# Patient Record
Sex: Female | Born: 1960 | Race: White | Hispanic: No | Marital: Married | Smoking: Never smoker
Health system: Southern US, Community
[De-identification: ages and names within clinical notes are randomized; demographics above are authoritative.]

---

## 2003-07-19 ENCOUNTER — Encounter: Admission: RE | Admit: 2003-07-19 | Discharge: 2003-07-19 | Payer: Self-pay

## 2004-08-06 ENCOUNTER — Encounter: Admission: RE | Admit: 2004-08-06 | Discharge: 2004-08-06 | Payer: Self-pay | Admitting: Internal Medicine

## 2005-08-21 ENCOUNTER — Encounter: Admission: RE | Admit: 2005-08-21 | Discharge: 2005-08-21 | Payer: Self-pay | Admitting: Internal Medicine

## 2006-09-30 ENCOUNTER — Encounter: Admission: RE | Admit: 2006-09-30 | Discharge: 2006-09-30 | Payer: Self-pay | Admitting: Internal Medicine

## 2007-10-11 ENCOUNTER — Encounter: Admission: RE | Admit: 2007-10-11 | Discharge: 2007-10-11 | Payer: Self-pay | Admitting: Internal Medicine

## 2008-10-16 ENCOUNTER — Encounter: Admission: RE | Admit: 2008-10-16 | Discharge: 2008-10-16 | Payer: Self-pay | Admitting: Internal Medicine

## 2009-10-17 ENCOUNTER — Encounter: Admission: RE | Admit: 2009-10-17 | Discharge: 2009-10-17 | Payer: Self-pay | Admitting: Internal Medicine

## 2010-07-27 ENCOUNTER — Encounter: Payer: Self-pay | Admitting: Internal Medicine

## 2010-09-22 ENCOUNTER — Other Ambulatory Visit: Payer: Self-pay | Admitting: *Deleted

## 2010-09-22 DIAGNOSIS — Z1231 Encounter for screening mammogram for malignant neoplasm of breast: Secondary | ICD-10-CM

## 2010-10-27 ENCOUNTER — Ambulatory Visit
Admission: RE | Admit: 2010-10-27 | Discharge: 2010-10-27 | Disposition: A | Discharge status: Home / Self Care | Payer: BC Managed Care – PPO | Source: Ambulatory Visit | Attending: *Deleted | Admitting: *Deleted

## 2010-10-27 ENCOUNTER — Ambulatory Visit: Payer: BC Managed Care – PPO

## 2010-10-27 DIAGNOSIS — Z1231 Encounter for screening mammogram for malignant neoplasm of breast: Secondary | ICD-10-CM

## 2011-10-07 ENCOUNTER — Other Ambulatory Visit: Payer: Self-pay | Admitting: Internal Medicine

## 2011-10-07 DIAGNOSIS — Z1231 Encounter for screening mammogram for malignant neoplasm of breast: Secondary | ICD-10-CM

## 2011-11-03 ENCOUNTER — Ambulatory Visit
Admission: RE | Admit: 2011-11-03 | Discharge: 2011-11-03 | Disposition: A | Discharge status: Home / Self Care | Payer: BC Managed Care – PPO | Source: Ambulatory Visit | Attending: Internal Medicine | Admitting: Internal Medicine

## 2011-11-03 DIAGNOSIS — Z1231 Encounter for screening mammogram for malignant neoplasm of breast: Secondary | ICD-10-CM

## 2012-10-04 ENCOUNTER — Other Ambulatory Visit: Payer: Self-pay

## 2012-10-04 DIAGNOSIS — Z1231 Encounter for screening mammogram for malignant neoplasm of breast: Secondary | ICD-10-CM

## 2012-11-03 ENCOUNTER — Ambulatory Visit
Admission: RE | Admit: 2012-11-03 | Discharge: 2012-11-03 | Disposition: A | Discharge status: Home / Self Care | Payer: BC Managed Care – PPO | Source: Ambulatory Visit

## 2012-11-03 DIAGNOSIS — Z1231 Encounter for screening mammogram for malignant neoplasm of breast: Secondary | ICD-10-CM

## 2013-09-29 ENCOUNTER — Other Ambulatory Visit: Payer: Self-pay

## 2013-09-29 DIAGNOSIS — Z1231 Encounter for screening mammogram for malignant neoplasm of breast: Secondary | ICD-10-CM

## 2013-11-06 ENCOUNTER — Encounter (INDEPENDENT_AMBULATORY_CARE_PROVIDER_SITE_OTHER): Payer: Self-pay

## 2013-11-06 ENCOUNTER — Ambulatory Visit
Admission: RE | Admit: 2013-11-06 | Discharge: 2013-11-06 | Disposition: A | Discharge status: Home / Self Care | Payer: BC Managed Care – PPO | Source: Ambulatory Visit

## 2013-11-06 DIAGNOSIS — Z1231 Encounter for screening mammogram for malignant neoplasm of breast: Secondary | ICD-10-CM

## 2014-12-24 ENCOUNTER — Other Ambulatory Visit: Payer: Self-pay

## 2014-12-24 DIAGNOSIS — Z1231 Encounter for screening mammogram for malignant neoplasm of breast: Secondary | ICD-10-CM

## 2015-01-14 ENCOUNTER — Ambulatory Visit: Payer: Self-pay

## 2015-01-21 ENCOUNTER — Ambulatory Visit
Admission: RE | Admit: 2015-01-21 | Discharge: 2015-01-21 | Disposition: A | Discharge status: Home / Self Care | Payer: BLUE CROSS/BLUE SHIELD | Source: Ambulatory Visit

## 2015-01-21 DIAGNOSIS — Z1231 Encounter for screening mammogram for malignant neoplasm of breast: Secondary | ICD-10-CM

## 2016-01-08 ENCOUNTER — Other Ambulatory Visit: Payer: Self-pay | Admitting: Internal Medicine

## 2016-01-08 DIAGNOSIS — Z1231 Encounter for screening mammogram for malignant neoplasm of breast: Secondary | ICD-10-CM

## 2016-02-03 ENCOUNTER — Ambulatory Visit
Admission: RE | Admit: 2016-02-03 | Discharge: 2016-02-03 | Disposition: A | Discharge status: Home / Self Care | Payer: BLUE CROSS/BLUE SHIELD | Source: Ambulatory Visit | Attending: Internal Medicine | Admitting: Internal Medicine

## 2016-02-03 DIAGNOSIS — Z1231 Encounter for screening mammogram for malignant neoplasm of breast: Secondary | ICD-10-CM

## 2017-05-14 ENCOUNTER — Other Ambulatory Visit: Payer: Self-pay | Admitting: Internal Medicine

## 2017-05-14 DIAGNOSIS — Z1231 Encounter for screening mammogram for malignant neoplasm of breast: Secondary | ICD-10-CM

## 2017-06-02 ENCOUNTER — Ambulatory Visit: Payer: BLUE CROSS/BLUE SHIELD | Admitting: Podiatry

## 2017-06-02 ENCOUNTER — Other Ambulatory Visit: Payer: Self-pay | Admitting: Podiatry

## 2017-06-02 ENCOUNTER — Ambulatory Visit: Payer: Self-pay

## 2017-06-02 ENCOUNTER — Ambulatory Visit (INDEPENDENT_AMBULATORY_CARE_PROVIDER_SITE_OTHER): Payer: BLUE CROSS/BLUE SHIELD

## 2017-06-02 ENCOUNTER — Encounter: Payer: Self-pay | Admitting: Podiatry

## 2017-06-02 DIAGNOSIS — M79672 Pain in left foot: Secondary | ICD-10-CM | POA: Diagnosis not present

## 2017-06-02 DIAGNOSIS — M722 Plantar fascial fibromatosis: Secondary | ICD-10-CM

## 2017-06-02 DIAGNOSIS — B351 Tinea unguium: Secondary | ICD-10-CM | POA: Diagnosis not present

## 2017-06-02 DIAGNOSIS — M79671 Pain in right foot: Secondary | ICD-10-CM | POA: Diagnosis not present

## 2017-06-02 MED ORDER — DICLOFENAC SODIUM 75 MG PO TBEC: 75.0000 mg | DELAYED_RELEASE_TABLET | Freq: Two times a day (BID) | ORAL | 2 refills | Status: AC

## 2017-06-02 MED ORDER — TRIAMCINOLONE ACETONIDE 10 MG/ML IJ SUSP
10.0000 mg | Freq: Once | INTRAMUSCULAR | Status: AC
  Administered 2017-06-02: 10 mg

## 2017-06-02 NOTE — Patient Instructions (Signed)

## 2017-06-02 NOTE — Progress Notes (Signed)
Subjective:    Patient ID: Stephanie Escobar, female   DOB: 56 y.o.   MRN: [id number]   HPI patient states that she walked barefoot on the beach and developed pain in her heel around 3 months ago. States it's been getting gradually worse over that time and the left is bothering her more than the right but they're both sore. Patient does not smoke and likes to be active    Review of Systems  All other systems reviewed and are negative.       Objective:  Physical Exam  Constitutional: She appears well-developed and well-nourished.  Cardiovascular: Intact distal pulses.  Pulmonary/Chest: Effort normal.  Musculoskeletal: Normal range of motion.  Neurological: She is alert.  Skin: Skin is warm.  Nursing note and vitals reviewed.  neurovascular status intact muscle strength adequate range of motion within normal limits with patient noted to have quite a bit of discomfort of the plantar fascial band left over right with inflammation fluid at the medial band at the insertional point of the tendon into the calcaneus. Patient does have high arch foot type with a lot of stress on the heel and forefoot     Assessment:    Inflammatory fasciitis left over right heel at the insertional point tendon into the calcaneus     Plan:   H&P conditions reviewed and injected the left plantar fashion 3 mg Kenalog 5 mg Xylocaine and applied fascial brace bilateral to lift the arch. Gave instructions on physical therapy and supportive shoes and reappoint to recheck  X-rays indicate cavus foot structure with spur formation plantar heel bilateral

## 2017-06-14 ENCOUNTER — Ambulatory Visit: Payer: BLUE CROSS/BLUE SHIELD

## 2017-06-18 ENCOUNTER — Encounter: Payer: Self-pay | Admitting: Podiatry

## 2017-06-18 ENCOUNTER — Ambulatory Visit (INDEPENDENT_AMBULATORY_CARE_PROVIDER_SITE_OTHER): Payer: BLUE CROSS/BLUE SHIELD | Admitting: Podiatry

## 2017-06-18 DIAGNOSIS — M722 Plantar fascial fibromatosis: Secondary | ICD-10-CM | POA: Diagnosis not present

## 2017-06-19 NOTE — Progress Notes (Signed)
Subjective:   Patient ID: Stephanie Escobar, female   DOB: 56 y.o.   MRN: [id number]   HPI Patient presents stating she is doing very well with minimal discomfort and is walking with minimal discomfort or pain at the current time   ROS      Objective:  Physical Exam  Neurovascular status intact with patient's heel doing very well with minimal discomfort upon palpation or swelling     Assessment:  Doing well from plantar fascial treatment over the last several months     Plan:  Reviewed at great length the fact this can recur we discussed different ways to prevent that from happening and also discussed consistent physical therapy to be done at home.  Patient will be seen back to recheck

## 2017-07-13 ENCOUNTER — Ambulatory Visit: Payer: BLUE CROSS/BLUE SHIELD

## 2017-07-19 ENCOUNTER — Ambulatory Visit
Admission: RE | Admit: 2017-07-19 | Discharge: 2017-07-19 | Disposition: A | Discharge status: Home / Self Care | Payer: BLUE CROSS/BLUE SHIELD | Source: Ambulatory Visit | Attending: Internal Medicine | Admitting: Internal Medicine

## 2017-07-19 DIAGNOSIS — Z1231 Encounter for screening mammogram for malignant neoplasm of breast: Secondary | ICD-10-CM

## 2018-07-06 HISTORY — PX: BREAST BIOPSY: SHX20

## 2018-07-15 ENCOUNTER — Other Ambulatory Visit: Payer: Self-pay | Admitting: Internal Medicine

## 2018-07-15 DIAGNOSIS — Z1231 Encounter for screening mammogram for malignant neoplasm of breast: Secondary | ICD-10-CM

## 2018-08-17 ENCOUNTER — Ambulatory Visit
Admission: RE | Admit: 2018-08-17 | Discharge: 2018-08-17 | Disposition: A | Discharge status: Home / Self Care | Payer: BLUE CROSS/BLUE SHIELD | Source: Ambulatory Visit | Attending: Internal Medicine | Admitting: Internal Medicine

## 2018-08-17 DIAGNOSIS — Z1231 Encounter for screening mammogram for malignant neoplasm of breast: Secondary | ICD-10-CM

## 2018-08-18 ENCOUNTER — Other Ambulatory Visit: Payer: Self-pay | Admitting: Internal Medicine

## 2018-08-18 DIAGNOSIS — R928 Other abnormal and inconclusive findings on diagnostic imaging of breast: Secondary | ICD-10-CM

## 2018-08-23 ENCOUNTER — Ambulatory Visit: Payer: BLUE CROSS/BLUE SHIELD

## 2018-08-29 ENCOUNTER — Ambulatory Visit
Admission: RE | Admit: 2018-08-29 | Discharge: 2018-08-29 | Disposition: A | Discharge status: Home / Self Care | Payer: BLUE CROSS/BLUE SHIELD | Source: Ambulatory Visit | Attending: Internal Medicine | Admitting: Internal Medicine

## 2018-08-29 ENCOUNTER — Other Ambulatory Visit: Payer: Self-pay | Admitting: Internal Medicine

## 2018-08-29 DIAGNOSIS — R928 Other abnormal and inconclusive findings on diagnostic imaging of breast: Secondary | ICD-10-CM

## 2018-08-29 DIAGNOSIS — N6310 Unspecified lump in the right breast, unspecified quadrant: Secondary | ICD-10-CM

## 2018-08-31 ENCOUNTER — Ambulatory Visit
Admission: RE | Admit: 2018-08-31 | Discharge: 2018-08-31 | Disposition: A | Discharge status: Home / Self Care | Payer: BLUE CROSS/BLUE SHIELD | Source: Ambulatory Visit | Attending: Internal Medicine | Admitting: Internal Medicine

## 2018-08-31 DIAGNOSIS — N6310 Unspecified lump in the right breast, unspecified quadrant: Secondary | ICD-10-CM

## 2018-09-01 ENCOUNTER — Other Ambulatory Visit: Payer: Self-pay | Admitting: Internal Medicine

## 2018-09-01 DIAGNOSIS — N63 Unspecified lump in unspecified breast: Secondary | ICD-10-CM

## 2018-09-06 ENCOUNTER — Other Ambulatory Visit: Payer: BLUE CROSS/BLUE SHIELD

## 2019-02-17 ENCOUNTER — Other Ambulatory Visit: Payer: Self-pay | Admitting: Internal Medicine

## 2019-02-17 DIAGNOSIS — N63 Unspecified lump in unspecified breast: Secondary | ICD-10-CM

## 2019-03-03 ENCOUNTER — Other Ambulatory Visit: Payer: BLUE CROSS/BLUE SHIELD

## 2019-03-07 ENCOUNTER — Other Ambulatory Visit: Payer: BLUE CROSS/BLUE SHIELD

## 2019-03-07 ENCOUNTER — Other Ambulatory Visit: Payer: Self-pay | Admitting: Internal Medicine

## 2019-03-07 DIAGNOSIS — D241 Benign neoplasm of right breast: Secondary | ICD-10-CM

## 2019-08-22 ENCOUNTER — Ambulatory Visit
Admission: RE | Admit: 2019-08-22 | Discharge: 2019-08-22 | Disposition: A | Discharge status: Home / Self Care | Payer: BLUE CROSS/BLUE SHIELD | Source: Ambulatory Visit | Attending: Internal Medicine | Admitting: Internal Medicine

## 2019-08-22 ENCOUNTER — Ambulatory Visit
Admission: RE | Admit: 2019-08-22 | Discharge: 2019-08-22 | Disposition: A | Discharge status: Home / Self Care | Payer: BC Managed Care – PPO | Source: Ambulatory Visit | Attending: Internal Medicine | Admitting: Internal Medicine

## 2019-08-22 ENCOUNTER — Other Ambulatory Visit: Payer: Self-pay

## 2019-08-22 DIAGNOSIS — D241 Benign neoplasm of right breast: Secondary | ICD-10-CM

## 2019-10-31 ENCOUNTER — Encounter (INDEPENDENT_AMBULATORY_CARE_PROVIDER_SITE_OTHER): Payer: BC Managed Care – PPO | Admitting: Ophthalmology

## 2019-11-06 DIAGNOSIS — H43821 Vitreomacular adhesion, right eye: Secondary | ICD-10-CM | POA: Insufficient documentation

## 2019-11-06 DIAGNOSIS — H2511 Age-related nuclear cataract, right eye: Secondary | ICD-10-CM | POA: Insufficient documentation

## 2019-11-06 DIAGNOSIS — R0683 Snoring: Secondary | ICD-10-CM | POA: Insufficient documentation

## 2019-11-06 DIAGNOSIS — H40003 Preglaucoma, unspecified, bilateral: Secondary | ICD-10-CM | POA: Insufficient documentation

## 2019-11-06 DIAGNOSIS — H43822 Vitreomacular adhesion, left eye: Secondary | ICD-10-CM | POA: Insufficient documentation

## 2019-11-06 DIAGNOSIS — H2512 Age-related nuclear cataract, left eye: Secondary | ICD-10-CM | POA: Insufficient documentation

## 2019-11-07 ENCOUNTER — Other Ambulatory Visit: Payer: Self-pay

## 2019-11-07 ENCOUNTER — Encounter (INDEPENDENT_AMBULATORY_CARE_PROVIDER_SITE_OTHER): Payer: Self-pay | Admitting: Ophthalmology

## 2019-11-07 ENCOUNTER — Encounter (INDEPENDENT_AMBULATORY_CARE_PROVIDER_SITE_OTHER): Payer: BC Managed Care – PPO | Admitting: Ophthalmology

## 2019-11-07 ENCOUNTER — Ambulatory Visit (INDEPENDENT_AMBULATORY_CARE_PROVIDER_SITE_OTHER): Payer: BC Managed Care – PPO | Admitting: Ophthalmology

## 2019-11-07 DIAGNOSIS — H43821 Vitreomacular adhesion, right eye: Secondary | ICD-10-CM | POA: Diagnosis not present

## 2019-11-07 DIAGNOSIS — H35371 Puckering of macula, right eye: Secondary | ICD-10-CM

## 2019-11-07 DIAGNOSIS — R0683 Snoring: Secondary | ICD-10-CM

## 2019-11-07 DIAGNOSIS — H2512 Age-related nuclear cataract, left eye: Secondary | ICD-10-CM

## 2019-11-07 DIAGNOSIS — H40003 Preglaucoma, unspecified, bilateral: Secondary | ICD-10-CM

## 2019-11-07 DIAGNOSIS — H43822 Vitreomacular adhesion, left eye: Secondary | ICD-10-CM

## 2019-11-07 DIAGNOSIS — H2511 Age-related nuclear cataract, right eye: Secondary | ICD-10-CM

## 2019-11-07 NOTE — Assessment & Plan Note (Signed)
No pathological findings with normal retinal nerve fiber layer thickness OU

## 2019-11-07 NOTE — Assessment & Plan Note (Signed)
Vitreomacular traction may cause vision loss from anatomic distortion to the center of the vision, the macula.  If visual function is symptomatic or threatened, therapy may be needed.  Surgical intervention offers the highest chance of visual stability and improvement.  Distortion of the macula anatomy may cause splitting of the retinal layers, termed foveomacular retinoschisis, which can cause more permanent vision loss.  Epiretinal membranes may also be associated.  Macular hole may also develop if vitreomacular traction progresses. The minor form of this condition is Vitreomacular adhesion, which is a natural change in the aging process of the eye, which requires observation only.  OU minor will observe

## 2019-11-07 NOTE — Assessment & Plan Note (Signed)
The nature of macular pucker (epiretinal membrane ERM) was discussed with the patient as well as threshold criteria for vitrectomy surgery. I explained that in rare cases another surgery is needed to actually remove a second wrinkle should it regrow.  Most often, the epiretinal membrane and underlying wrinkled internal limiting membrane are removed with the first surgery, to accomplish the goals.   If the operative eye is Phakic (natural lens still present), cataract surgery is often recommended prior to Vitrectomy. This will enable the retina surgeon to have the best view during surgery and the patient to obtain optimal results in the future. Treatment options were discussed.  OD, minor and of no pathologic significance at this time

## 2019-11-07 NOTE — Progress Notes (Signed)
11/07/2019     CHIEF COMPLAINT Patient presents for Retina Follow Up   HISTORY OF PRESENT ILLNESS: Stephanie Escobar is a 59 y.o. female who presents to the clinic today for:   HPI    Retina Follow Up    Patient presents with  Other.  In both eyes.  Duration of 1 year.  Since onset it is stable.          Comments    1 year follow up - OCT OU Patient denies change in vision and overall has no complaints.        Last edited by Gerda Diss on 11/07/2019  3:43 PM. (History)      Referring physician: Aurea Graff, MD [address] [place],  [place] [address]  HISTORICAL INFORMATION:   Selected notes from the [facility]: No current outpatient medications on file. (Ophthalmic Drugs)   No current facility-administered medications for this visit. (Ophthalmic Drugs)   Current Outpatient Medications (Other)  Medication Sig  . cyanocobalamin 1000 MCG tablet Take by mouth.  . estradiol-norethindrone (COMBIPATCH) 0.05-0.25 MG/DAY Place onto the skin.  Marland Kitchen ascorbic acid (VITAMIN C) 500 MG tablet Take by mouth.  Marland Kitchen aspirin 81 MG EC tablet Take by mouth.  . Biotin 5 MG CAPS Take by mouth.  . Cholecalciferol 50 MCG (2000 UT) CAPS Take by mouth.  . diclofenac (VOLTAREN) 75 MG EC tablet Take 1 tablet (75 mg total) by mouth 2 (two) times daily.  Marland Kitchen loratadine (CLARITIN) 10 MG tablet Take by mouth.  . pseudoephedrine (SUDAFED) 60 MG tablet Take by mouth.   No current facility-administered medications for this visit. (Other)      REVIEW OF SYSTEMS:    ALLERGIES No Known Allergies  PAST MEDICAL HISTORY History reviewed. No pertinent past medical history. Past Surgical History:  Procedure Laterality Date  . BREAST BIOPSY Right 2020   FA    FAMILY HISTORY Family History  Problem Relation Age of Onset  . Breast cancer Maternal Grandmother     SOCIAL HISTORY Social History   Tobacco Use  . Smoking  status: Never Smoker  . Smokeless tobacco: Never Used  Substance Use Topics  . Alcohol use: Not on file  . Drug use: Not on file         OPHTHALMIC EXAM:  Base Eye Exam    Visual Acuity (Snellen - Linear)      Right Left   Dist cc 20/20 20/20   Correction: Glasses       Tonometry (Tonopen, 3:54 PM)      Right Left   Pressure 12 13       Pupils      Pupils Dark Light Shape React APD   Right PERRL 4 3 Round Slow None   Left PERRL 4 3 Round Slow None       Visual Fields (Counting fingers)      Left Right    Full Full       Extraocular Movement      Right Left    Full Full       Neuro/Psych    Oriented x3: Yes   Mood/Affect: Normal       Dilation    Both eyes: 2.5% Phenylephrine @ 3:54 PM        Slit Lamp and Fundus Exam    External Exam      Right Left   External Normal Normal  Slit Lamp Exam      Right Left   Lids/Lashes Normal Normal   Conjunctiva/Sclera White and quiet White and quiet   Cornea Clear Clear   Anterior Chamber Deep and quiet Deep and quiet   Iris Round and reactive Round and reactive   Vitreous Normal Normal          IMAGING AND PROCEDURES  Imaging and Procedures for 11/07/19  OCT, Retina - OU - Both Eyes       Right Eye Quality was good. Scan locations included subfoveal. Central Foveal Thickness: 323. Progression has been stable. Findings include vitreomacular adhesion , normal observations, normal foveal contour, epiretinal membrane.   Left Eye Quality was good. Scan locations included subfoveal. Central Foveal Thickness: 324. Progression has been stable. Findings include vitreomacular adhesion , normal foveal contour.   Notes Minor epiretinal membrane in the far temporal periphery of the macula right eye.  Vitreal macular adhesion of no significance.  OS with vitreal macular adherence, no significance normal foveal architecture                ASSESSMENT/PLAN:  Macular pucker, right eye The nature  of macular pucker (epiretinal membrane ERM) was discussed with the patient as well as threshold criteria for vitrectomy surgery. I explained that in rare cases another surgery is needed to actually remove a second wrinkle should it regrow.  Most often, the epiretinal membrane and underlying wrinkled internal limiting membrane are removed with the first surgery, to accomplish the goals.   If the operative eye is Phakic (natural lens still present), cataract surgery is often recommended prior to Vitrectomy. This will enable the retina surgeon to have the best view during surgery and the patient to obtain optimal results in the future. Treatment options were discussed.  OD, minor and of no pathologic significance at this time  Vitreomacular adhesion of left eye Vitreomacular traction may cause vision loss from anatomic distortion to the center of the vision, the macula.  If visual function is symptomatic or threatened, therapy may be needed.  Surgical intervention offers the highest chance of visual stability and improvement.  Distortion of the macula anatomy may cause splitting of the retinal layers, termed foveomacular retinoschisis, which can cause more permanent vision loss.  Epiretinal membranes may also be associated.  Macular hole may also develop if vitreomacular traction progresses. The minor form of this condition is Vitreomacular adhesion, which is a natural change in the aging process of the eye, which requires observation only.  OU minor will observe  Glaucoma suspect of both eyes No pathological findings with normal retinal nerve fiber layer thickness OU      ICD-10-CM   1. Macular pucker, right eye  H35.371   2. Vitreomacular adhesion of right eye  H43.821 OCT, Retina - OU - Both Eyes  3. Vitreomacular adhesion of left eye  H43.822   4. Glaucoma suspect of both eyes  H40.003     1.  Incidentally, retinal nerve fiber layer analysis is of normal thickness in each eye.  There is no  evidence for glaucoma in each eye.  2.  Observe minor macular pucker OD  3.  Vitreal macular adhesion of no pathologic significance OU  Ophthalmic Meds Ordered this visit:  No orders of the defined types were placed in this encounter.      Return in about 1 year (around 11/06/2020) for DILATE OU, OCT.  There are no Patient Instructions on file for this visit.   Explained the  diagnoses, plan, and follow up with the patient and they expressed understanding.  Patient expressed understanding of the importance of proper follow up care.   Clent Demark Jadarrius Maselli M.D. Diseases & Surgery of the Retina and Vitreous [facility]abetic [facility] 11/07/19     Abbreviations: M myopia (nearsighted); A astigmatism; H hyperopia (farsighted); P presbyopia; Mrx spectacle prescription;  CTL contact lenses; OD right eye; OS left eye; OU both eyes  XT exotropia; ET esotropia; PEK punctate epithelial keratitis; PEE punctate epithelial erosions; DES dry eye syndrome; MGD meibomian gland dysfunction; ATs artificial tears; PFAT's preservative free artificial tears; [place] nuclear sclerotic cataract; PSC posterior subcapsular cataract; ERM epi-retinal membrane; PVD posterior vitreous detachment; RD retinal detachment; DM diabetes mellitus; DR diabetic retinopathy; NPDR non-proliferative diabetic retinopathy; PDR proliferative diabetic retinopathy; CSME clinically significant macular edema; DME diabetic macular edema; dbh dot blot hemorrhages; CWS cotton wool spot; POAG primary open angle glaucoma; C/D cup-to-disc ratio; HVF humphrey visual field; GVF goldmann visual field; OCT optical coherence tomography; IOP intraocular pressure; BRVO Branch retinal vein occlusion; CRVO central retinal vein occlusion; CRAO central retinal artery occlusion; BRAO branch retinal artery occlusion; RT retinal tear; SB scleral buckle; PPV pars plana vitrectomy; VH Vitreous hemorrhage; PRP panretinal laser photocoagulation; IVK intravitreal kenalog;  VMT vitreomacular traction; MH Macular hole;  NVD neovascularization of the disc; NVE neovascularization elsewhere; AREDS age related eye disease study; ARMD age related macular degeneration; POAG primary open angle glaucoma; EBMD epithelial/anterior basement membrane dystrophy; ACIOL anterior chamber intraocular lens; IOL intraocular lens; PCIOL posterior chamber intraocular lens; Phaco/IOL phacoemulsification with intraocular lens placement; [place] photorefractive keratectomy; LASIK laser assisted in situ keratomileusis; HTN hypertension; DM diabetes mellitus; COPD chronic obstructive pulmonary disease

## 2019-12-20 IMAGING — MG DIGITAL DIAGNOSTIC UNILATERAL RIGHT MAMMOGRAM WITH TOMO AND CAD
6 series · 6 of 18 positions shown · non-contrast
Comparison: Previous exam(s).

CLINICAL DATA: 58-year-old female presenting for evaluation of a
right breast asymmetry.

EXAM:
DIGITAL DIAGNOSTIC RIGHT MAMMOGRAM WITH CAD AND TOMO
ULTRASOUND RIGHT BREAST

[R ML synth-2D]
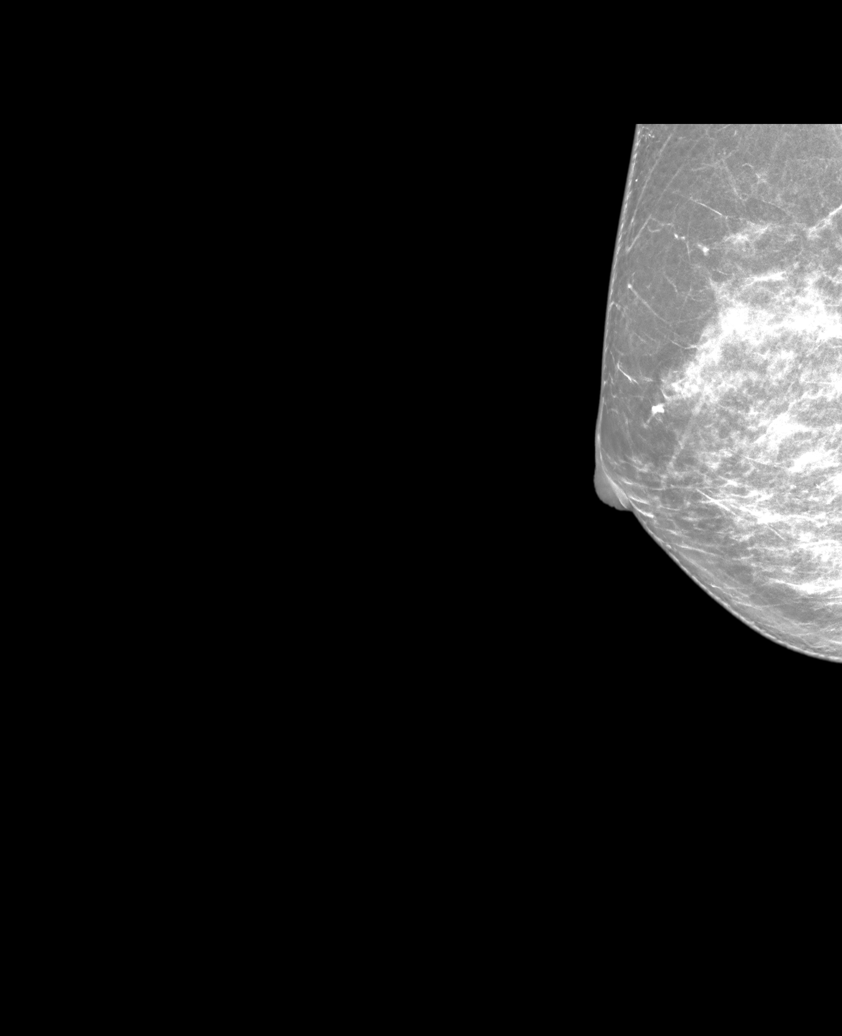

[R MLO synth-2D]
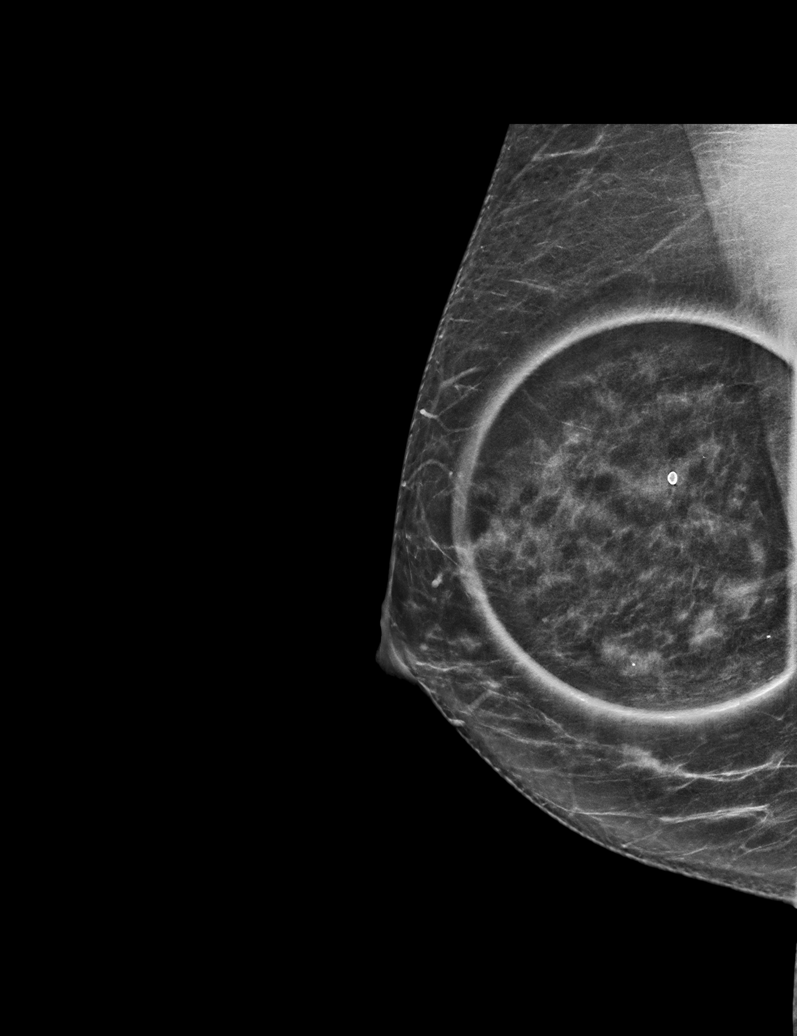

[R XCCL synth-2D]
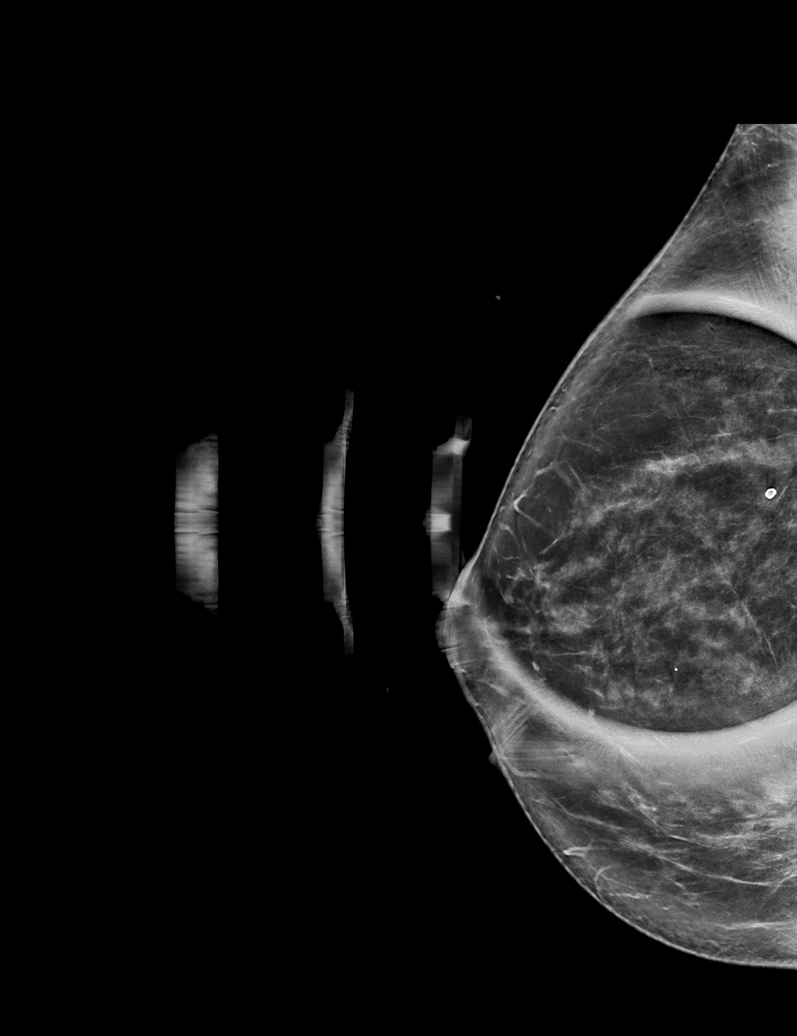

[R ML tomo · tomo slice 35/68.0]
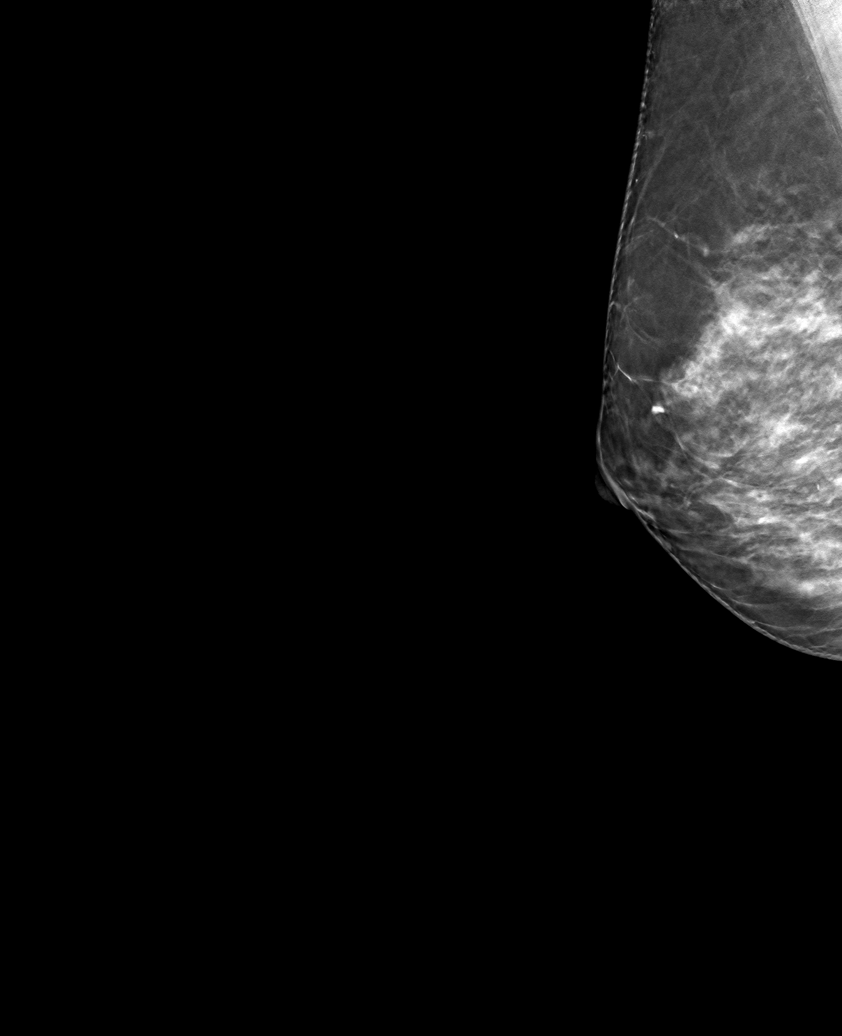

[R MLO tomo · tomo slice 37/72.0]
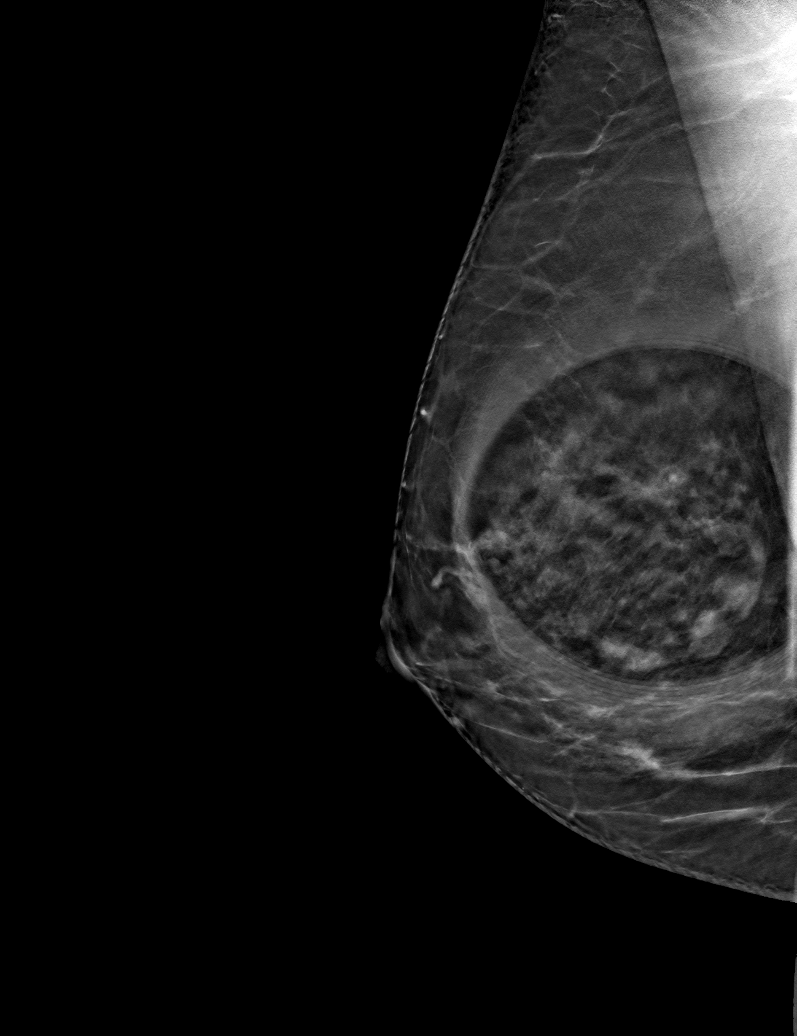

[R XCCL tomo · tomo slice 41/81.0]
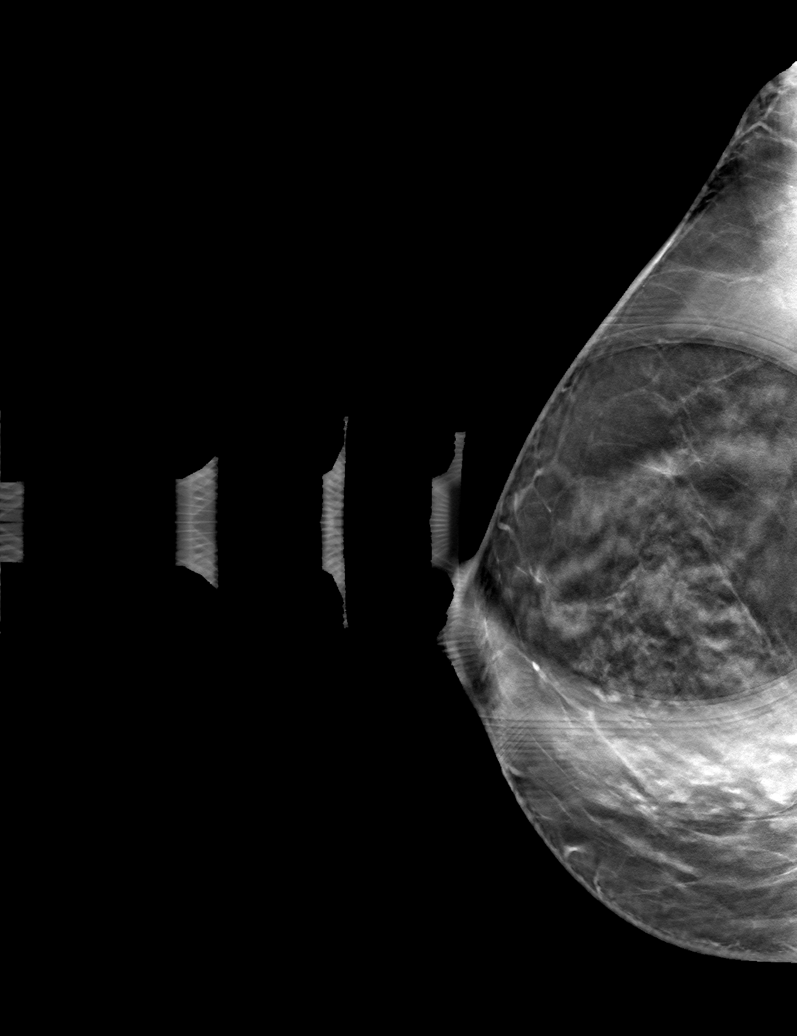

[6 of 18 positions shown; findings below may reference images not displayed]

ACR Breast Density Category c: The breast tissue is heterogeneously
dense, which may obscure small masses.
FINDINGS: Spot compression tomosynthesis images through the lateral aspect of
the right breast demonstrates a possible obscured mass in the
slightly lower outer quadrant.

Mammographic images were processed with CAD.

On physical exam, no suspicious palpable masses are identified in
the lateral aspect of the right breast.

Ultrasound of the right breast at 8 o'clock, 4 cm from the nipple
demonstrates 2 adjacent hypoechoic masses with indistinct margins
together measuring 7 x 4 x 4 mm. No blood flow is seen within the
masses on color Doppler imaging. Ultrasound of the right axilla
demonstrates multiple normal-appearing lymph nodes.
IMPRESSION: 1. There are 2 indeterminate adjacent masses in the right breast at
8 o'clock.

2.  No evidence of right axillary lymphadenopathy.

RECOMMENDATION:
Ultrasound guided biopsy is recommended for the 2 adjacent masses in
the right breast. Given that they are essentially abutting 1
another, they may be biopsied together as one. The procedure has
been scheduled for 08/31/2018 at [DATE] a.m..

I have discussed the findings and recommendations with the patient.
Results were also provided in writing at the conclusion of the
visit. If applicable, a reminder letter will be sent to the patient
regarding the next appointment.

BI-RADS CATEGORY  4: Suspicious.

## 2019-12-20 IMAGING — US ULTRASOUND RIGHT BREAST LIMITED
1 series · 9 of 9 positions shown · non-contrast
Comparison: Previous exam(s).

CLINICAL DATA: 58-year-old female presenting for evaluation of a
right breast asymmetry.

EXAM:
DIGITAL DIAGNOSTIC RIGHT MAMMOGRAM WITH CAD AND TOMO
ULTRASOUND RIGHT BREAST

[Series 1: ultrasound right breast limited · 0.05mm/px · 9 of 9 slices shown]
[im 1/9]
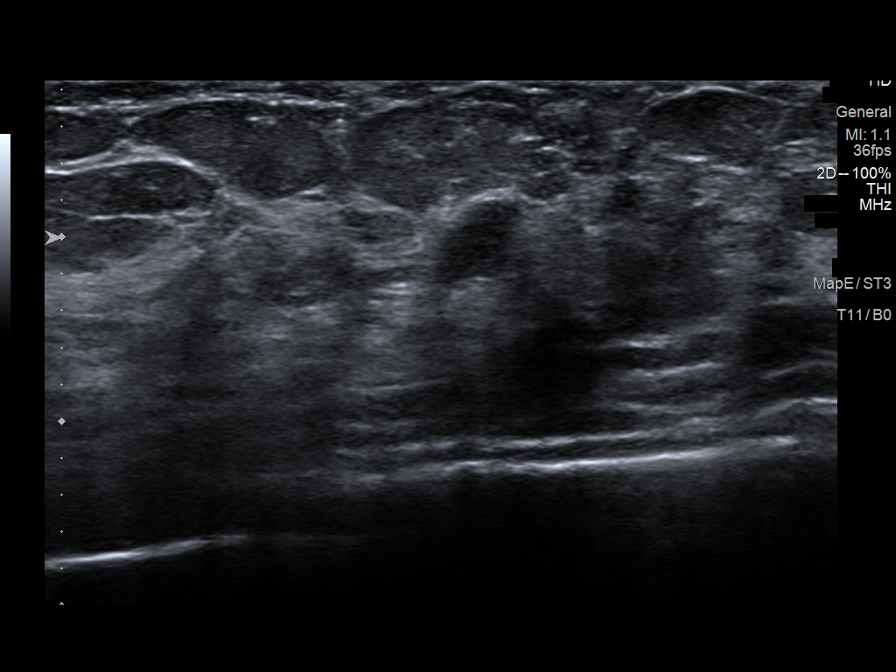
[im 2/9]
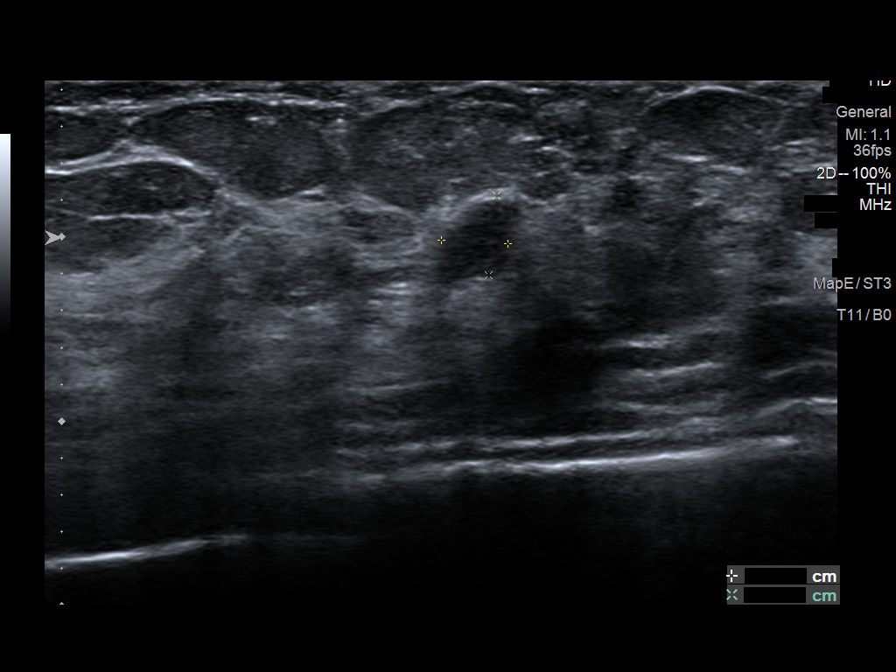
[im 3/9]
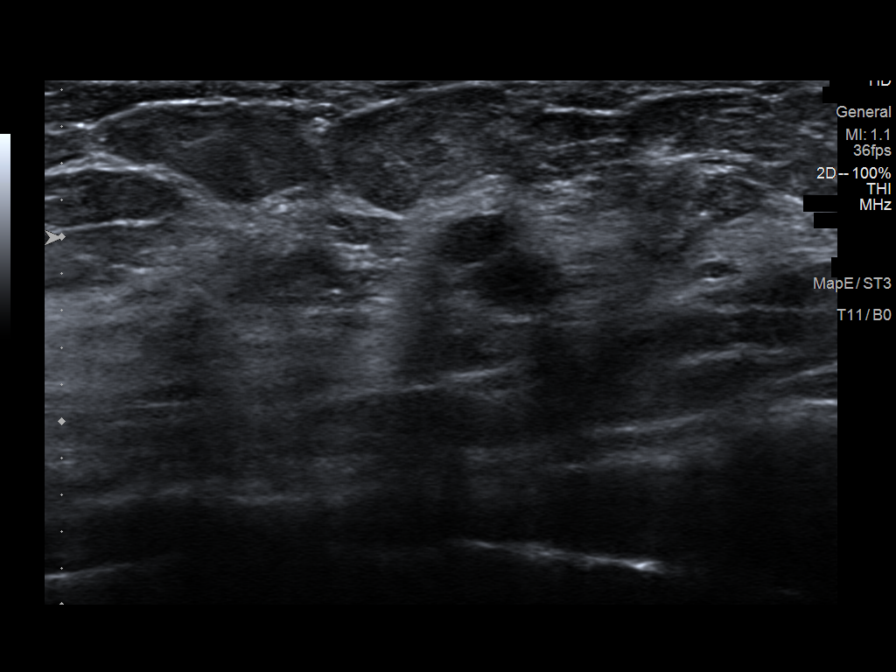
[im 4/9]
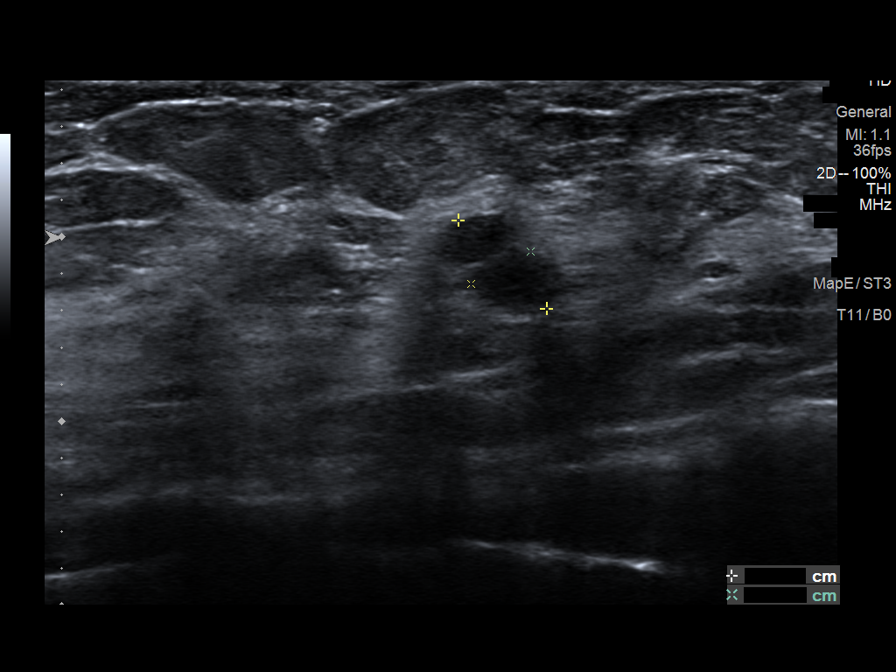
[im 5/9]
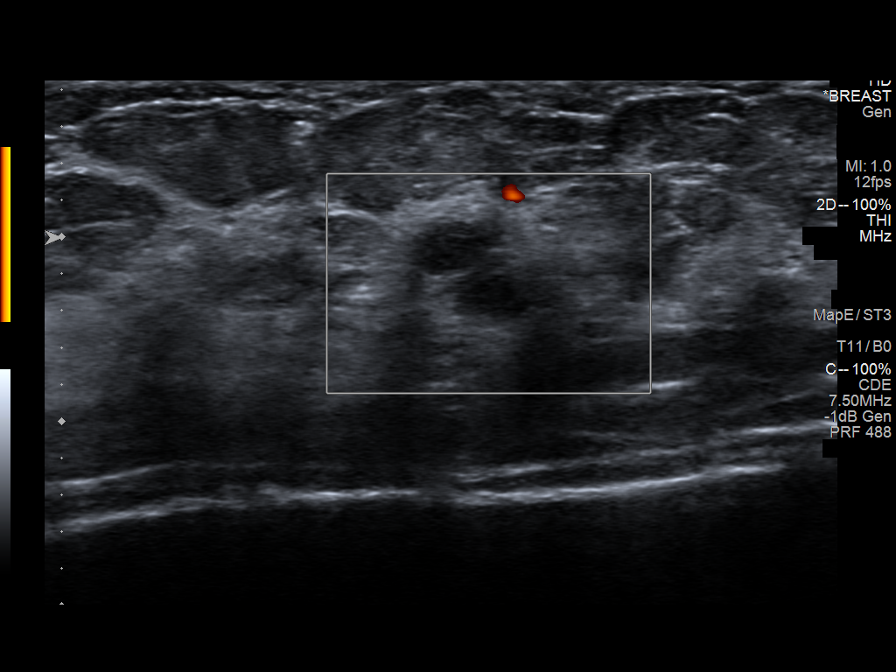
[im 6/9]
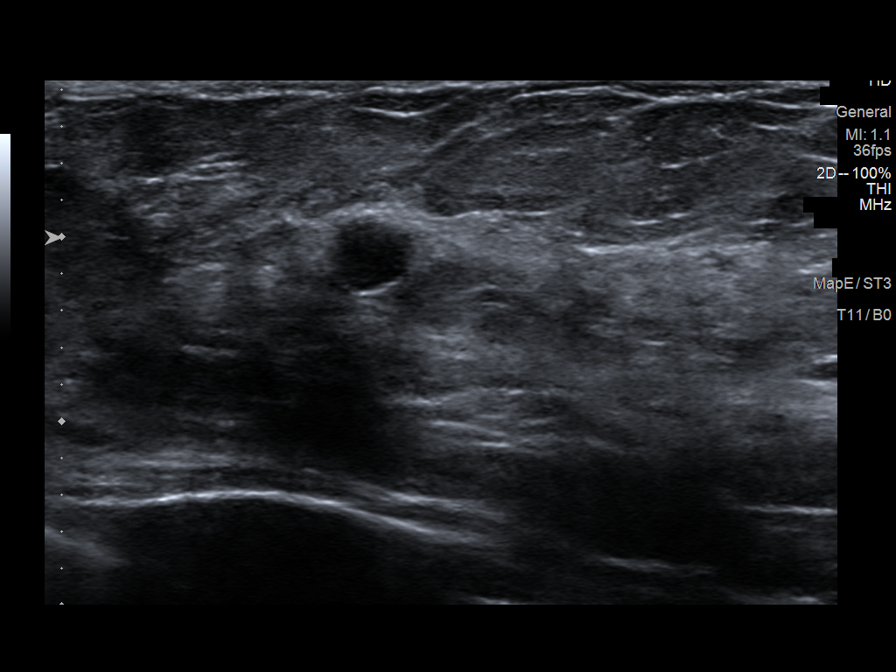
[im 7/9]
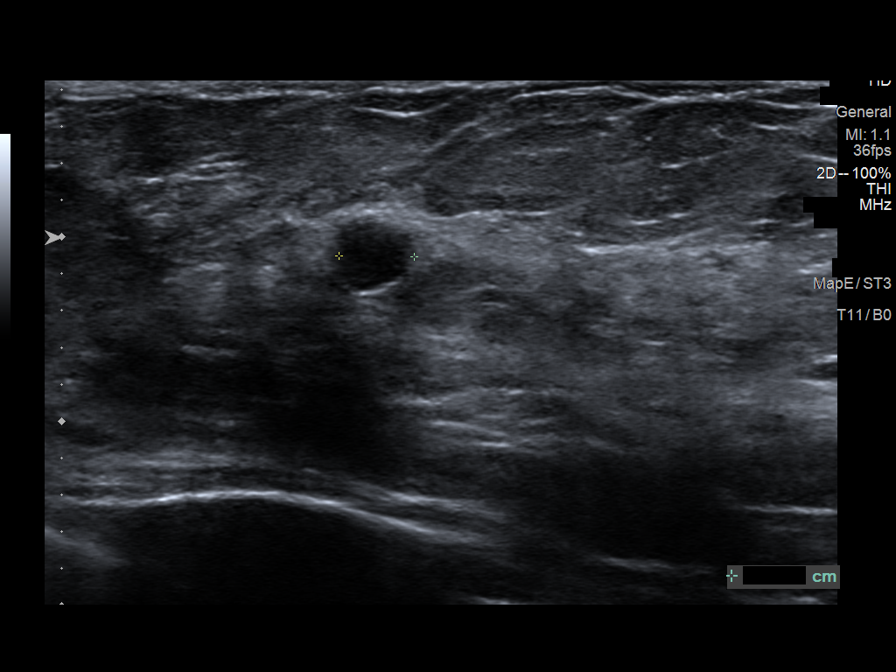
[im 8/9]
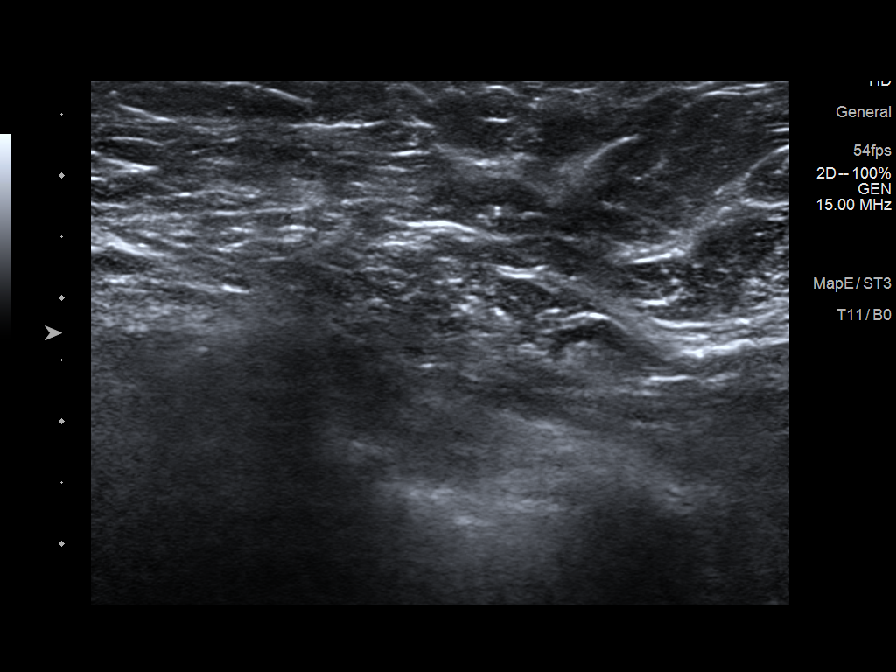
[im 9/9]
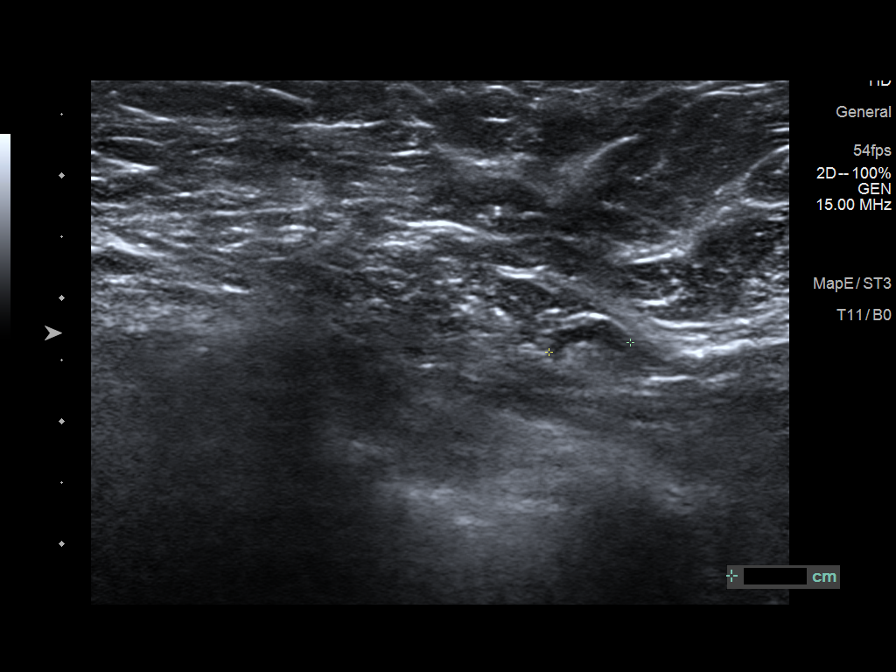

[9 of 9 positions shown; findings below may reference images not displayed]

ACR Breast Density Category c: The breast tissue is heterogeneously
dense, which may obscure small masses.
FINDINGS: Spot compression tomosynthesis images through the lateral aspect of
the right breast demonstrates a possible obscured mass in the
slightly lower outer quadrant.

Mammographic images were processed with CAD.

On physical exam, no suspicious palpable masses are identified in
the lateral aspect of the right breast.

Ultrasound of the right breast at 8 o'clock, 4 cm from the nipple
demonstrates 2 adjacent hypoechoic masses with indistinct margins
together measuring 7 x 4 x 4 mm. No blood flow is seen within the
masses on color Doppler imaging. Ultrasound of the right axilla
demonstrates multiple normal-appearing lymph nodes.
IMPRESSION: 1. There are 2 indeterminate adjacent masses in the right breast at
8 o'clock.

2.  No evidence of right axillary lymphadenopathy.

RECOMMENDATION:
Ultrasound guided biopsy is recommended for the 2 adjacent masses in
the right breast. Given that they are essentially abutting 1
another, they may be biopsied together as one. The procedure has
been scheduled for 08/31/2018 at [DATE] a.m..

I have discussed the findings and recommendations with the patient.
Results were also provided in writing at the conclusion of the
visit. If applicable, a reminder letter will be sent to the patient
regarding the next appointment.

BI-RADS CATEGORY  4: Suspicious.

## 2020-09-24 ENCOUNTER — Other Ambulatory Visit: Payer: Self-pay | Admitting: Family Medicine

## 2020-09-24 DIAGNOSIS — Z1231 Encounter for screening mammogram for malignant neoplasm of breast: Secondary | ICD-10-CM

## 2020-11-05 ENCOUNTER — Encounter (INDEPENDENT_AMBULATORY_CARE_PROVIDER_SITE_OTHER): Payer: BC Managed Care – PPO | Admitting: Ophthalmology

## 2020-11-19 ENCOUNTER — Ambulatory Visit
Admission: RE | Admit: 2020-11-19 | Discharge: 2020-11-19 | Disposition: A | Discharge status: Home / Self Care | Payer: BC Managed Care – PPO | Source: Ambulatory Visit | Attending: Family Medicine | Admitting: Family Medicine

## 2020-11-19 ENCOUNTER — Other Ambulatory Visit: Payer: Self-pay

## 2020-11-19 DIAGNOSIS — Z1231 Encounter for screening mammogram for malignant neoplasm of breast: Secondary | ICD-10-CM

## 2020-11-21 ENCOUNTER — Other Ambulatory Visit: Payer: Self-pay | Admitting: Family Medicine

## 2020-11-21 DIAGNOSIS — R928 Other abnormal and inconclusive findings on diagnostic imaging of breast: Secondary | ICD-10-CM

## 2020-11-28 ENCOUNTER — Ambulatory Visit
Admission: RE | Admit: 2020-11-28 | Discharge: 2020-11-28 | Disposition: A | Discharge status: Home / Self Care | Payer: BC Managed Care – PPO | Source: Ambulatory Visit | Attending: Family Medicine | Admitting: Family Medicine

## 2020-11-28 ENCOUNTER — Other Ambulatory Visit: Payer: Self-pay

## 2020-11-28 DIAGNOSIS — R928 Other abnormal and inconclusive findings on diagnostic imaging of breast: Secondary | ICD-10-CM

## 2020-12-03 ENCOUNTER — Encounter (INDEPENDENT_AMBULATORY_CARE_PROVIDER_SITE_OTHER): Payer: BC Managed Care – PPO | Admitting: Ophthalmology

## 2020-12-09 ENCOUNTER — Other Ambulatory Visit: Payer: Self-pay

## 2020-12-09 ENCOUNTER — Encounter (INDEPENDENT_AMBULATORY_CARE_PROVIDER_SITE_OTHER): Payer: Self-pay | Admitting: Ophthalmology

## 2020-12-09 ENCOUNTER — Ambulatory Visit (INDEPENDENT_AMBULATORY_CARE_PROVIDER_SITE_OTHER): Payer: BC Managed Care – PPO | Admitting: Ophthalmology

## 2020-12-09 DIAGNOSIS — H43822 Vitreomacular adhesion, left eye: Secondary | ICD-10-CM | POA: Diagnosis not present

## 2020-12-09 DIAGNOSIS — H2511 Age-related nuclear cataract, right eye: Secondary | ICD-10-CM | POA: Diagnosis not present

## 2020-12-09 DIAGNOSIS — H43821 Vitreomacular adhesion, right eye: Secondary | ICD-10-CM

## 2020-12-09 DIAGNOSIS — H35371 Puckering of macula, right eye: Secondary | ICD-10-CM | POA: Diagnosis not present

## 2020-12-09 NOTE — Progress Notes (Signed)
12/09/2020     CHIEF COMPLAINT Patient presents for Retina Follow Up (1 year fu OU and OCT/Pt states [facility] OU stable since last visit. Pt denies FOL, floaters, or ocular pain OU. /)   HISTORY OF PRESENT ILLNESS: Stephanie Escobar is a 60 y.o. female who presents to the clinic today for:   HPI    Retina Follow Up    Diagnosis: Other   Laterality: both eyes   Onset: 1 year ago   Severity: mild   Duration: 1 year   Course: stable   Comments: 1 year fu OU and OCT Pt states VA OU stable since last visit. Pt denies FOL, floaters, or ocular pain OU.         Last edited by Kendra Opitz, COA on 12/09/2020  3:51 PM. (History)      Referring physician: Aurea Graff, MD [address] [place],  [place] [address]  HISTORICAL INFORMATION:   Selected notes from the [facility]: No current outpatient medications on file. (Ophthalmic Drugs)   No current facility-administered medications for this visit. (Ophthalmic Drugs)   Current Outpatient Medications (Other)  Medication Sig  . ascorbic acid (VITAMIN C) 500 MG tablet Take by mouth.  Marland Kitchen aspirin 81 MG EC tablet Take by mouth.  . Biotin 5 MG CAPS Take by mouth.  . Cholecalciferol 50 MCG (2000 UT) CAPS Take by mouth.  . cyanocobalamin 1000 MCG tablet Take by mouth.  . diclofenac (VOLTAREN) 75 MG EC tablet Take 1 tablet (75 mg total) by mouth 2 (two) times daily.  Marland Kitchen estradiol-norethindrone (COMBIPATCH) 0.05-0.25 MG/DAY Place onto the skin.  Marland Kitchen loratadine (CLARITIN) 10 MG tablet Take by mouth.  . pseudoephedrine (SUDAFED) 60 MG tablet Take by mouth.   No current facility-administered medications for this visit. (Other)      REVIEW OF SYSTEMS:    ALLERGIES No Known Allergies  PAST MEDICAL HISTORY History reviewed. No pertinent past medical history. Past Surgical History:  Procedure Laterality Date  . BREAST BIOPSY Right 2020   FA    FAMILY HISTORY Family  History  Problem Relation Age of Onset  . Breast cancer Maternal Grandmother     SOCIAL HISTORY Social History   Tobacco Use  . Smoking status: Never Smoker  . Smokeless tobacco: Never Used         OPHTHALMIC EXAM: Base Eye Exam    Visual Acuity (ETDRS)      Right Left   Dist cc 20/20 20/20   Correction: Glasses       Tonometry (Tonopen, 3:54 PM)      Right Left   Pressure 13 10       Pupils      Pupils Dark Light Shape React APD   Right PERRL 4 3 Round Slow None   Left PERRL 4 3 Round Slow None       Visual Fields (Counting fingers)      Left Right    Full Full       Extraocular Movement      Right Left    Full Full       Neuro/Psych    Oriented x3: Yes   Mood/Affect: Normal       Dilation    Both eyes: 1.0% Mydriacyl, 2.5% Phenylephrine @ 3:54 PM        Slit Lamp and Fundus Exam    External Exam  Right Left   External Normal Normal       Slit Lamp Exam      Right Left   Lids/Lashes Normal Normal   Conjunctiva/Sclera White and quiet White and quiet   Cornea Clear Clear   Anterior Chamber Deep and quiet Deep and quiet   Iris Round and reactive Round and reactive   Lens 1+ Nuclear sclerosis 1+ Nuclear sclerosis   Anterior Vitreous Normal Normal       Fundus Exam      Right Left   Posterior Vitreous Normal Normal   Disc Normal Normal   C/D Ratio 0.35 0.35   Macula no topo distortion, Epiretinal membrane by shiny ILM reflex OD Normal   Vessels Normal Normal   Periphery Normal Normal          IMAGING AND PROCEDURES  Imaging and Procedures for 12/09/20  OCT, Retina - OU - Both Eyes       Right Eye Quality was good. Scan locations included subfoveal. Central Foveal Thickness: 321. Progression has been stable. Findings include vitreomacular adhesion , normal observations, normal foveal contour, epiretinal membrane.   Left Eye Quality was good. Scan locations included subfoveal. Central Foveal Thickness: 324. Progression has  been stable. Findings include vitreomacular adhesion , normal foveal contour.   Notes Minor epiretinal membrane in the far temporal periphery of the macula right eye.  Vitreal macular adhesion of no significance.  OS with vitreal macular adherence, no significance normal foveal architecture                ASSESSMENT/PLAN:  Macular pucker, right eye Minor and temporal to the foveal region, no distortion of the macula will observe  Vitreomacular adhesion of right eye Physiologic, no pathologic changes  Vitreomacular adhesion of left eye Not pathologic  Nuclear sclerotic cataract of right eye Minor cataract OU      ICD-10-CM   1. Macular pucker, right eye  H35.371   2. Vitreomacular adhesion of right eye  H43.821   3. Vitreomacular adhesion of left eye  H43.822 OCT, Retina - OU - Both Eyes  4. Nuclear sclerotic cataract of right eye  H25.11     1.  Epiretinal membrane in the right eye is minor, temporal portion the macula likely related to minor vitreomacular adhesion which remains.  2.  No impact on acuity OU.  3.  Observe OU  Ophthalmic Meds Ordered this visit:  No orders of the defined types were placed in this encounter.      Return in about 2 years (around 12/10/2022) for DILATE OU, OCT.  There are no Patient Instructions on file for this visit.   Explained the diagnoses, plan, and follow up with the patient and they expressed understanding.  Patient expressed understanding of the importance of proper follow up care.   Clent Demark Avonna Iribe M.D. Diseases & Surgery of the Retina and Vitreous Retina [facility] Diabetic [facility] 12/09/20     Abbreviations: M myopia (nearsighted); A astigmatism; H hyperopia (farsighted); P presbyopia; Mrx spectacle prescription;  CTL contact lenses; OD right eye; OS left eye; OU both eyes  XT exotropia; ET esotropia; PEK punctate epithelial keratitis; PEE punctate epithelial erosions; DES dry eye syndrome; MGD meibomian gland  dysfunction; ATs artificial tears; PFAT's preservative free artificial tears; [place] nuclear sclerotic cataract; PSC posterior subcapsular cataract; ERM epi-retinal membrane; PVD posterior vitreous detachment; RD retinal detachment; DM diabetes mellitus; DR diabetic retinopathy; NPDR non-proliferative diabetic retinopathy; PDR proliferative diabetic retinopathy; CSME clinically significant macular edema;  DME diabetic macular edema; dbh dot blot hemorrhages; CWS cotton wool spot; POAG primary open angle glaucoma; C/D cup-to-disc ratio; HVF humphrey visual field; GVF goldmann visual field; OCT optical coherence tomography; IOP intraocular pressure; BRVO Branch retinal vein occlusion; CRVO central retinal vein occlusion; CRAO central retinal artery occlusion; BRAO branch retinal artery occlusion; RT retinal tear; SB scleral buckle; PPV pars plana vitrectomy; VH Vitreous hemorrhage; PRP panretinal laser photocoagulation; IVK intravitreal kenalog; VMT vitreomacular traction; MH Macular hole;  NVD neovascularization of the disc; NVE neovascularization elsewhere; AREDS age related eye disease study; ARMD age related macular degeneration; POAG primary open angle glaucoma; EBMD epithelial/anterior basement membrane dystrophy; ACIOL anterior chamber intraocular lens; IOL intraocular lens; PCIOL posterior chamber intraocular lens; Phaco/IOL phacoemulsification with intraocular lens placement; [place] photorefractive keratectomy; LASIK laser assisted in situ keratomileusis; HTN hypertension; DM diabetes mellitus; COPD chronic obstructive pulmonary disease

## 2020-12-09 NOTE — Assessment & Plan Note (Signed)
Physiologic, no pathologic changes

## 2020-12-09 NOTE — Assessment & Plan Note (Signed)
Minor cataract OU

## 2020-12-09 NOTE — Assessment & Plan Note (Signed)
Minor and temporal to the foveal region, no distortion of the macula will observe

## 2020-12-09 NOTE — Assessment & Plan Note (Signed)
Not pathologic

## 2021-12-30 ENCOUNTER — Other Ambulatory Visit: Payer: Self-pay | Admitting: Family Medicine

## 2021-12-30 DIAGNOSIS — Z1231 Encounter for screening mammogram for malignant neoplasm of breast: Secondary | ICD-10-CM

## 2022-01-08 ENCOUNTER — Ambulatory Visit
Admission: RE | Admit: 2022-01-08 | Discharge: 2022-01-08 | Disposition: A | Discharge status: Home / Self Care | Payer: BC Managed Care – PPO | Source: Ambulatory Visit | Attending: Family Medicine | Admitting: Family Medicine

## 2022-01-08 DIAGNOSIS — Z1231 Encounter for screening mammogram for malignant neoplasm of breast: Secondary | ICD-10-CM

## 2022-01-09 ENCOUNTER — Ambulatory Visit: Payer: BC Managed Care – PPO

## 2022-10-01 ENCOUNTER — Encounter (INDEPENDENT_AMBULATORY_CARE_PROVIDER_SITE_OTHER): Payer: BC Managed Care – PPO | Admitting: Ophthalmology

## 2022-12-03 ENCOUNTER — Other Ambulatory Visit: Payer: Self-pay | Admitting: Family Medicine

## 2022-12-03 DIAGNOSIS — Z1231 Encounter for screening mammogram for malignant neoplasm of breast: Secondary | ICD-10-CM

## 2023-01-11 ENCOUNTER — Ambulatory Visit: Admission: RE | Admit: 2023-01-11 | Payer: BC Managed Care – PPO | Source: Ambulatory Visit

## 2023-01-11 DIAGNOSIS — Z1231 Encounter for screening mammogram for malignant neoplasm of breast: Secondary | ICD-10-CM

## 2024-07-13 ENCOUNTER — Encounter: Payer: Self-pay | Admitting: Family Medicine

## 2024-07-13 DIAGNOSIS — Z1231 Encounter for screening mammogram for malignant neoplasm of breast: Secondary | ICD-10-CM
